# Patient Record
Sex: Male | Born: 1990
Health system: Southern US, Community
[De-identification: ages and names within clinical notes are randomized; demographics above are authoritative.]

## PROBLEM LIST (undated history)

## (undated) DIAGNOSIS — L03119 Cellulitis of unspecified part of limb: Secondary | ICD-10-CM

## (undated) HISTORY — DX: Cellulitis of unspecified part of limb: L03.119

---
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
## 2010-06-11 NOTE — Op Note (Signed)
NAME:  Chad Cooper, Chad Cooper              ACCOUNT NO.:  [id number]   MEDICAL RECORD NO.:  [id number]          PATIENT TYPE:  AMB   LOCATION:  DSC                          FACILITY:  [facility]   PHYSICIAN:  Loreta Ave, M.D. DATE OF BIRTH:  December 19, 1990   DATE OF PROCEDURE:  11/25/2007  DATE OF DISCHARGE:                               OPERATIVE REPORT   PREOPERATIVE DIAGNOSIS:  Nonunion, middle third fractures, scaphoid left  wrist.   POSTOPERATIVE DIAGNOSIS:  Nonunion, middle third fractures, scaphoid  left wrist.   PROCEDURE:  Open reduction and internal fixation, scaphoid, nonunion  with a 20-mm standard Acutrak screw.   SURGEON:  Loreta Ave, MD   ASSISTANT:  Genene Churn. Barry Dienes, [place], present throughout the entire case  necessary for timely completion of procedure.   ANESTHESIA:  General.   BLOOD LOSS:  Minimal.   TOURNIQUET TIME:  1 hour.   SPECIMENS:  None.   CULTURES:  None.   COMPLICATIONS:  None.   DRESSING:  Soft compressive with thumb spica splint.   PROCEDURE:  The patient was brought to the operating room and after  adequate anesthesia had been obtained, tourniquet applied to upper  aspect of left arm.  Prepped and draped in usual sterile fashion.  Exsanguinated with elevation and Esmarch.  Tourniquet inflated to 250  mmHg.  Fluoroscopic guidance throughout.  Incision over the volar aspect  of scaphoid.  Skin and subcutaneous tissue divided.  Tendons elevated.  Radial artery protected.  Volar wrist capsule identified and incised.  Scaphoid identified.  Although there was an obvious nonunion by CT scan  and x-ray, once exposed the site, there was actually a little bit of  healing around the margin.  There was not of enough erosion in the  fracture and given the amount that was there, I felt taking this down  with do more harm than good.  I therefore traversed the area of nonunion  with multiple K-wire drives from distal to proximal.  I then put in a K-  wire for  the screw across the fracture site.  Confirmed good position.  I drilled and then solidly fixed with a 20-mm Acutrak screw.  This was  countersunk.  The guidewire removed.  Excellent compression, capturing,  and fixation and the fracture line could no longer be seen once the  screw was in place.  Care was taken to be sure it was protruding in  any aspect the wrist.  Wound irrigated.  Capsule closed with Vicryl.  Skin closed with nylon.  Sterile compressive dressing and thumb spica  splint applied.  Tourniquet deflated and removed.  Anesthesia reversed.  Brought to recovery room.  He tolerated the surgery well.  No  complications.      Loreta Ave, M.D.  Electronically Signed     DFM/MEDQ  D:  11/25/2007  T:  11/26/2007  Job:  [id number]

## 2010-10-29 LAB — POCT HEMOGLOBIN-HEMACUE: Hemoglobin: 17.3 — ABNORMAL HIGH

## 2014-06-12 ENCOUNTER — Ambulatory Visit: Payer: Self-pay | Admitting: Primary Care

## 2014-06-19 ENCOUNTER — Ambulatory Visit (INDEPENDENT_AMBULATORY_CARE_PROVIDER_SITE_OTHER): Payer: 59 | Admitting: Internal Medicine

## 2014-06-19 ENCOUNTER — Encounter: Payer: Self-pay | Admitting: Internal Medicine

## 2014-06-19 VITALS — BP 117/76 | HR 76 | Temp 98.4°F | Ht 66.0 in | Wt 235.0 lb

## 2014-06-19 DIAGNOSIS — L03119 Cellulitis of unspecified part of limb: Secondary | ICD-10-CM | POA: Diagnosis not present

## 2014-06-19 MED ORDER — DOXYCYCLINE HYCLATE 100 MG PO TABS
100.0000 mg | ORAL_TABLET | Freq: Two times a day (BID) | ORAL | Status: DC
Start: 2014-06-19 — End: 2014-09-01

## 2014-06-19 MED ORDER — RIFAMPIN 300 MG PO CAPS: 300.0000 mg | ORAL_CAPSULE | Freq: Two times a day (BID) | ORAL | Status: DC

## 2014-06-19 MED ORDER — MUPIROCIN 2 % EX OINT: 1.0000 "application " | TOPICAL_OINTMENT | Freq: Two times a day (BID) | CUTANEOUS | Status: DC

## 2014-06-19 MED ORDER — CHLORHEXIDINE GLUCONATE 4 % EX LIQD: Freq: Every day | CUTANEOUS | Status: DC | PRN

## 2014-06-19 NOTE — Progress Notes (Signed)
Subjective:    Patient ID: Chad Cooper, male    DOB: [birth year], 24 y.o.   MRN: [id number]  HPI Chad Cooper is a healthy 24yo Male who has had recurrent MRSA skin infections. He states that it started In 10th grade, presumed left leg lesions after trauma. He now has at least 3-4 episodes a year of having skin lesions.  He first notices first having inguinal lymph node swelling, hot and tender to touch, with swelling as well as indurated. He feels that some of these episodes are triggered by scratch of skin. No fever, or chills. Just getting doxycycline course, 10 day course. Improved after end of prescription. He often goes promptly to the MD for care so his skin lesions does not progress into abscess. He finished his last abtx course from 4/18-4/28. No active skin problems presently. He brings with him pictures on iphone showing chronology of onset of rash/cellulitis No Known Allergies  Active Ambulatory Problems    Diagnosis Date Noted  . Recurrent cellulitis of lower leg 06/19/2014   Resolved Ambulatory Problems    Diagnosis Date Noted  . No Resolved Ambulatory Problems   No Additional Past Medical History  - left wrist fracture, with HW fixation  History  Substance Use Topics  . Smoking status: Never Smoker   . Smokeless tobacco: Never Used  . Alcohol Use: No  family history is not on file.  Review of Systems  Constitutional: Negative for fever, chills, diaphoresis, activity change, appetite change, fatigue and unexpected weight change.  HENT: Negative for congestion, sore throat, rhinorrhea, sneezing, trouble swallowing and sinus pressure.  Eyes: Negative for photophobia and visual disturbance.  Respiratory: Negative for cough, chest tightness, shortness of breath, wheezing and stridor.  Cardiovascular: Negative for chest pain, palpitations and leg swelling.  Gastrointestinal: Negative for nausea, vomiting, abdominal pain, diarrhea, constipation, blood in stool, abdominal  distention and anal bleeding.  Genitourinary: Negative for dysuria, hematuria, flank pain and difficulty urinating.  Musculoskeletal: Negative for myalgias, back pain, joint swelling, arthralgias and gait problem.  Skin: Negative for color change, pallor, rash and wound.  Neurological: Negative for dizziness, tremors, weakness and light-headedness.  Hematological: Negative for adenopathy. Does not bruise/bleed easily.  Psychiatric/Behavioral: Negative for behavioral problems, confusion, sleep disturbance, dysphoric mood, decreased concentration and agitation.       Objective:   Physical Exam BP 117/76 mmHg  Pulse 76  Temp(Src) 98.4 F (36.9 C) (Oral)  Ht  (1.676 m)  Wt 235 lb (106.595 kg)  BMI 37.95 kg/m2 Physical Exam  Constitutional: He is oriented to person, place, and time. He appears well-developed and well-nourished. No distress.  HENT:  Mouth/Throat: Oropharynx is clear and moist. No oropharyngeal exudate.  Cardiovascular: Normal rate, regular rhythm and normal heart sounds. Exam reveals no gallop and no friction rub.  No murmur heard.  Pulmonary/Chest: Effort normal and breath sounds normal. No respiratory distress. He has no wheezes.  Abdominal: Soft. Bowel sounds are normal. He exhibits no distension. There is no tenderness.  Lymphadenopathy:  He has no cervical adenopathy.  Neurological: He is alert and oriented to person, place, and time.  Skin: Skin is warm and dry. No rash noted. No erythema. Faint hyperpigmented scar to left ankle Psychiatric: He has a normal mood and affect. His behavior is normal.          Assessment & Plan:  - will check mrsa and do decolonizatoin, will give bactrim plus rifampin plus hibiclens daily and mupiricin bid x 10 days  to see if that will break the cycle of skin lesions.  - rtc prn, see if need to image affected leg

## 2014-06-21 LAB — NASAL CULTURE (N/P): Organism ID, Bacteria: NORMAL

## 2014-09-01 ENCOUNTER — Telehealth: Payer: Self-pay | Admitting: *Deleted

## 2014-09-01 DIAGNOSIS — L03119 Cellulitis of unspecified part of limb: Secondary | ICD-10-CM

## 2014-09-01 MED ORDER — DOXYCYCLINE HYCLATE 100 MG PO TABS: 100.0000 mg | ORAL_TABLET | Freq: Two times a day (BID) | ORAL | Status: DC

## 2014-09-01 NOTE — Telephone Encounter (Signed)
Patient called and advised the Cellulitis on his leg is back and wants to know what he should do now. He was told to give [place] a call if it reoccurred. Advised him will let his doctor know and call him back. He advised it is swollen, red and a little painful just as it was when it first happened. Reports no fever, chills or sweats.

## 2014-09-01 NOTE — Addendum Note (Signed)
Addended by: Lurlean Leyden on: 09/01/2014 05:01 PM   Modules accepted: Orders

## 2014-09-01 NOTE — Telephone Encounter (Signed)
Called the patient and verified his pharmacy information and scheduled him an appt to see the doctor. He was unable to come Thursday but can come Tuesday.

## 2014-09-01 NOTE — Telephone Encounter (Signed)
Can you prescribe him 10 day course of doxycycline  BID and see if we can see him at the end of next week.(thur add on)

## 2014-09-05 ENCOUNTER — Encounter: Payer: Self-pay | Admitting: Internal Medicine

## 2014-09-05 ENCOUNTER — Ambulatory Visit (INDEPENDENT_AMBULATORY_CARE_PROVIDER_SITE_OTHER): Payer: 59 | Admitting: Internal Medicine

## 2014-09-05 VITALS — BP 121/77 | HR 76 | Temp 97.3°F | Wt 230.0 lb

## 2014-09-05 DIAGNOSIS — L03119 Cellulitis of unspecified part of limb: Secondary | ICD-10-CM | POA: Diagnosis not present

## 2014-09-05 MED ORDER — DOXYCYCLINE HYCLATE 100 MG PO TABS: 100.0000 mg | ORAL_TABLET | Freq: Two times a day (BID) | ORAL | Status: DC
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
## 2015-07-27 ENCOUNTER — Telehealth: Payer: Self-pay | Admitting: *Deleted

## 2015-07-27 DIAGNOSIS — L03119 Cellulitis of unspecified part of limb: Secondary | ICD-10-CM

## 2015-07-27 NOTE — Telephone Encounter (Signed)
Patient called triage to see if Dr. Drue SecondSnider can call him in a refill of Doxy, stating that his cellulitis has flared up again. He reports taking the prescription given to him last year for another flare, took his grandmother's keflex for another flare in February.  Patient is startign a new job, is without insurance at this time and cannot come in for a visit.  Please advise. Andree CossHowell, Chad Stidham M, RN

## 2015-08-01 MED ORDER — DOXYCYCLINE HYCLATE 100 MG PO TABS: 100.0000 mg | ORAL_TABLET | Freq: Two times a day (BID) | ORAL | Status: DC

## 2015-08-01 NOTE — Telephone Encounter (Signed)
Can give him a doxy 100mg  bid x 7 days

## 2015-08-01 NOTE — Addendum Note (Signed)
Addended by: Andree CossHOWELL, Oluwateniola Leitch M on: 08/01/2015 04:05 PM   Modules accepted: Orders

## 2015-08-01 NOTE — Telephone Encounter (Signed)
Sent prescription, notified patient. Andree CossHowell, Michelle M, RN

## 2015-10-05 ENCOUNTER — Telehealth: Payer: Self-pay

## 2015-10-05 NOTE — Telephone Encounter (Signed)
We haven't seen him since May 2016. Would recommend he goes to urgent care or can fit him in on Monday to assess his cellulitis

## 2015-10-05 NOTE — Telephone Encounter (Signed)
Patient called requesting antibiotics for a flare up of cellulitis on his leg. Patient states he still does not have insurance and will not have any until January. Explained to patient once a response is given he will be notified with a response. Patient stated understanding. Please advise. Rejeana Brockandace Sharyon Peitz, LPN

## 2015-10-09 NOTE — Telephone Encounter (Signed)
Patient left message on triage voicemail asking how much a doctor's visit would be, as he currently does not have insurance.  Andree CossHowell, Suleika Donavan M, RN

## 2015-10-09 NOTE — Telephone Encounter (Signed)
Called patient and relayed Dr. Drue SecondSnider response. Patient stated he will go to urgent care because it will be cheaper. Rejeana Brockandace Murray, LPN

## 2016-09-10 ENCOUNTER — Emergency Department (HOSPITAL_COMMUNITY): Payer: 59

## 2016-09-10 ENCOUNTER — Emergency Department (HOSPITAL_COMMUNITY)
Admission: EM | Admit: 2016-09-10 | Discharge: 2016-09-10 | Disposition: A | Discharge status: Home / Self Care | Payer: 59 | Attending: Emergency Medicine | Admitting: Emergency Medicine

## 2016-09-10 ENCOUNTER — Encounter (HOSPITAL_COMMUNITY): Payer: Self-pay

## 2016-09-10 DIAGNOSIS — M79662 Pain in left lower leg: Secondary | ICD-10-CM | POA: Diagnosis present

## 2016-09-10 DIAGNOSIS — L03116 Cellulitis of left lower limb: Secondary | ICD-10-CM

## 2016-09-10 DIAGNOSIS — R590 Localized enlarged lymph nodes: Secondary | ICD-10-CM | POA: Insufficient documentation

## 2016-09-10 DIAGNOSIS — R591 Generalized enlarged lymph nodes: Secondary | ICD-10-CM

## 2016-09-10 LAB — CBC WITH DIFFERENTIAL/PLATELET
BASOS PCT: 0 %
Basophils Absolute: 0 10*3/uL (ref 0.0–0.1)
EOS ABS: 0 10*3/uL (ref 0.0–0.7)
EOS PCT: 0 %
HEMATOCRIT: 46.6 % (ref 39.0–52.0)
HEMOGLOBIN: 17.1 g/dL — AB (ref 13.0–17.0)
LYMPHS ABS: 0.8 10*3/uL (ref 0.7–4.0)
Lymphocytes Relative: 6 %
MCH: 31.5 pg (ref 26.0–34.0)
MCHC: 36.7 g/dL — AB (ref 30.0–36.0)
MCV: 86 fL (ref 78.0–100.0)
MONO ABS: 0.4 10*3/uL (ref 0.1–1.0)
MONOS PCT: 3 %
NEUTROS PCT: 91 %
Neutro Abs: 11.3 10*3/uL — ABNORMAL HIGH (ref 1.7–7.7)
Platelets: 142 10*3/uL — ABNORMAL LOW (ref 150–400)
RBC: 5.42 MIL/uL (ref 4.22–5.81)
RDW: 12.2 % (ref 11.5–15.5)
WBC: 12.5 10*3/uL — ABNORMAL HIGH (ref 4.0–10.5)

## 2016-09-10 LAB — URINALYSIS, ROUTINE W REFLEX MICROSCOPIC
Bilirubin Urine: NEGATIVE
Glucose, UA: NEGATIVE mg/dL
HGB URINE DIPSTICK: NEGATIVE
Ketones, ur: NEGATIVE mg/dL
Leukocytes, UA: NEGATIVE
Nitrite: NEGATIVE
PH: 5 (ref 5.0–8.0)
Protein, ur: NEGATIVE mg/dL
SPECIFIC GRAVITY, URINE: 1.034 — AB (ref 1.005–1.030)

## 2016-09-10 LAB — COMPREHENSIVE METABOLIC PANEL
ALK PHOS: 35 U/L — AB (ref 38–126)
ALT: 26 U/L (ref 17–63)
AST: 37 U/L (ref 15–41)
Albumin: 4.3 g/dL (ref 3.5–5.0)
Anion gap: 8 (ref 5–15)
BUN: 19 mg/dL (ref 6–20)
CALCIUM: 9.1 mg/dL (ref 8.9–10.3)
CHLORIDE: 100 mmol/L — AB (ref 101–111)
CO2: 29 mmol/L (ref 22–32)
CREATININE: 1.25 mg/dL — AB (ref 0.61–1.24)
GFR calc non Af Amer: >60 mL/min (ref >60)
Glucose, Bld: 138 mg/dL — ABNORMAL HIGH (ref 65–99)
Potassium: 3.6 mmol/L (ref 3.5–5.1)
SODIUM: 137 mmol/L (ref 135–145)
Total Bilirubin: 1.4 mg/dL — ABNORMAL HIGH (ref 0.3–1.2)
Total Protein: 6.7 g/dL (ref 6.5–8.1)

## 2016-09-10 MED ORDER — SODIUM CHLORIDE 0.9 % IV BOLUS (SEPSIS)
1000.0000 mL | Freq: Once | INTRAVENOUS | Status: AC
  Administered 2016-09-10: 1000 mL via INTRAVENOUS

## 2016-09-10 MED ORDER — SODIUM CHLORIDE 0.9 % IV SOLN
1000.0000 mL | INTRAVENOUS | Status: DC
  Administered 2016-09-10: 1000 mL via INTRAVENOUS

## 2016-09-10 MED ORDER — IBUPROFEN 800 MG PO TABS: 800.0000 mg | ORAL_TABLET | Freq: Three times a day (TID) | ORAL | 0 refills | Status: AC

## 2016-09-10 MED ORDER — VANCOMYCIN HCL IN DEXTROSE 1-5 GM/200ML-% IV SOLN
1000.0000 mg | Freq: Once | INTRAVENOUS | Status: AC
  Administered 2016-09-10: 1000 mg via INTRAVENOUS
  Filled 2016-09-10: qty 200

## 2016-09-10 MED ORDER — DEXTROSE 5 % IV SOLN
1.0000 g | Freq: Once | INTRAVENOUS | Status: AC
  Administered 2016-09-10: 1 g via INTRAVENOUS
  Filled 2016-09-10: qty 10

## 2016-09-10 MED ORDER — DOXYCYCLINE HYCLATE 100 MG PO CAPS: 100.0000 mg | ORAL_CAPSULE | Freq: Two times a day (BID) | ORAL | 0 refills | Status: DC

## 2016-09-10 MED ORDER — GADOBENATE DIMEGLUMINE 529 MG/ML IV SOLN
20.0000 mL | Freq: Once | INTRAVENOUS | Status: AC | PRN
  Administered 2016-09-10: 20 mL via INTRAVENOUS

## 2016-09-10 NOTE — ED Provider Notes (Signed)
Patient rechecked at 2045: No evidence. He is hemodynamically stable.MRI results iscussed with patient and his mother. Discharge medication doxycycline 100 mg twice a day   Donnetta Hutchingook, Delight Bickle, MD 09/10/16 2059

## 2016-09-10 NOTE — ED Notes (Signed)
Infectious disease re-paged to Dr. Donnald GarrePfeiffer.

## 2016-09-10 NOTE — ED Triage Notes (Signed)
Pt endorses left sided groin pain since this am with some lightheadedness. Pt has recurrent cellulitis of lower extremities x 10 years. Denies urinary sx. AxOx4.

## 2016-09-10 NOTE — ED Notes (Signed)
Pt still in MRI 

## 2016-09-10 NOTE — ED Notes (Signed)
ED Provider at bedside. 

## 2016-09-10 NOTE — Discharge Instructions (Signed)
MRI showed no ate problems.  Prescription for doxycycline. Follow-up with Dr. Ilsa IhaSnyder [phone]

## 2016-09-10 NOTE — ED Notes (Addendum)
Vancomycin and bolus restarted, paused in MRI for scan. Spoke with Dr. Adriana Simasook, infectious disease waiting on pt MRI to result. Pt and family updated to delay.

## 2016-09-10 NOTE — ED Notes (Signed)
Patient transported to MRI 

## 2016-09-10 NOTE — ED Notes (Signed)
Pt returned from MRI °

## 2016-09-10 NOTE — ED Notes (Signed)
Per MRI, pt to be transported to scan in approx 2 hours.

## 2016-09-10 NOTE — ED Notes (Signed)
Declined W/C at D/C and was escorted to lobby by RN. 
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
## 2016-09-12 ENCOUNTER — Emergency Department (HOSPITAL_BASED_OUTPATIENT_CLINIC_OR_DEPARTMENT_OTHER)
Admission: EM | Admit: 2016-09-12 | Discharge: 2016-09-12 | Disposition: A | Discharge status: Home / Self Care | Payer: 59 | Attending: Emergency Medicine | Admitting: Emergency Medicine

## 2016-09-12 ENCOUNTER — Encounter (HOSPITAL_BASED_OUTPATIENT_CLINIC_OR_DEPARTMENT_OTHER): Payer: Self-pay

## 2016-09-12 DIAGNOSIS — L03116 Cellulitis of left lower limb: Secondary | ICD-10-CM | POA: Insufficient documentation

## 2016-09-12 DIAGNOSIS — M79662 Pain in left lower leg: Secondary | ICD-10-CM | POA: Diagnosis present

## 2016-09-12 LAB — CBC WITH DIFFERENTIAL/PLATELET
BASOS ABS: 0 10*3/uL (ref 0.0–0.1)
BASOS PCT: 0 %
EOS ABS: 0 10*3/uL (ref 0.0–0.7)
EOS PCT: 1 %
HCT: 44.8 % (ref 39.0–52.0)
Hemoglobin: 16.3 g/dL (ref 13.0–17.0)
Lymphocytes Relative: 16 %
Lymphs Abs: 1.3 10*3/uL (ref 0.7–4.0)
MCH: 31.5 pg (ref 26.0–34.0)
MCHC: 36.4 g/dL — ABNORMAL HIGH (ref 30.0–36.0)
MCV: 86.7 fL (ref 78.0–100.0)
MONO ABS: 0.5 10*3/uL (ref 0.1–1.0)
Monocytes Relative: 7 %
Neutro Abs: 6 10*3/uL (ref 1.7–7.7)
Neutrophils Relative %: 76 %
PLATELETS: 122 10*3/uL — AB (ref 150–400)
RBC: 5.17 MIL/uL (ref 4.22–5.81)
RDW: 12.3 % (ref 11.5–15.5)
WBC: 7.8 10*3/uL (ref 4.0–10.5)

## 2016-09-12 LAB — BASIC METABOLIC PANEL
ANION GAP: 9 (ref 5–15)
BUN: 13 mg/dL (ref 6–20)
CALCIUM: 8.4 mg/dL — AB (ref 8.9–10.3)
CO2: 26 mmol/L (ref 22–32)
Chloride: 104 mmol/L (ref 101–111)
Creatinine, Ser: 1.04 mg/dL (ref 0.61–1.24)
GFR calc Af Amer: >60 mL/min (ref >60)
GLUCOSE: 95 mg/dL (ref 65–99)
POTASSIUM: 3.7 mmol/L (ref 3.5–5.1)
SODIUM: 139 mmol/L (ref 135–145)

## 2016-09-12 MED ORDER — CEFAZOLIN SODIUM 1 G IJ SOLR
INTRAMUSCULAR | Status: AC
  Administered 2016-09-12: 2000 mg
  Filled 2016-09-12: qty 20

## 2016-09-12 MED ORDER — CLINDAMYCIN HCL 300 MG PO CAPS: 600.0000 mg | ORAL_CAPSULE | Freq: Three times a day (TID) | ORAL | 0 refills | Status: DC

## 2016-09-12 MED ORDER — CEFAZOLIN SODIUM-DEXTROSE 2-4 GM/100ML-% IV SOLN
2.0000 g | Freq: Once | INTRAVENOUS | Status: AC
Start: 2016-09-12 — End: 2016-09-12
  Administered 2016-09-12: 2 g via INTRAVENOUS
  Filled 2016-09-12: qty 100

## 2016-09-12 MED ORDER — CEPHALEXIN 500 MG PO CAPS: 500.0000 mg | ORAL_CAPSULE | Freq: Four times a day (QID) | ORAL | 0 refills | Status: AC

## 2016-09-12 MED FILL — CEPHALEXIN 500 MG CAPSULE: 500 | 10 days supply | Qty: 40 | Fill #0

## 2016-09-12 MED FILL — CLINDAMYCIN HCL 300 MG CAPS: 300 | 7 days supply | Qty: 42 | Fill #0

## 2016-09-12 NOTE — Discharge Instructions (Signed)
Please read instructions below. Stop taking the Doxycycline. Begin taking Keflex/cephalexin, 4 times per day, until it is gone. Begin taking Clindamycin, 3 times per day, until it is gone. You can take Advil every 6 hours as needed for pain. Attend your appointment with the infectious disease clinic next week for follow-up. Return to the ER for fever, nausea, vomiting, or new or concerning symptoms.

## 2016-09-12 NOTE — ED Provider Notes (Signed)
[facility] Provider Note   CSN: [id number] Arrival date & time: 09/12/16  1247     History   Chief Complaint Chief Complaint  Patient presents with  . Wound Check    HPI Chad Cooper is a 26 y.o. male presenting for recheck of cellulitis on R lower leg that began Wednesday morning. Patient reported to the ED on Wednesday, where he received IV vancomycin and Rocephin, with normal MRI of leg. Dr. Algis Liming with infectious disease was consulted during this visit as was noted to be making Dr. Drue Second aware of patient. He was discharged on doxycycline and instructions to follow up in 2 days. Pt reports hx of recurrent cellulitis to left lower leg without known cause. States it normally resolves with oral doxycycline. Patient reports to this ED today for follow-up, stating redness has expanded with worsening pain. States he has been taking the antibiotic as prescribed. Denies fever, chills, nausea, vomiting, or any other symptoms today. States he has attempted to see Infectious Disease clinic in the past for this issue, and was told to return with active infection, however states he has been unable to get an appointment in a reasonable amount of time.   The history is provided by the patient.    Past Medical History:  Diagnosis Date  . Recurrent cellulitis of lower leg     Patient Active Problem List   Diagnosis Date Noted  . Recurrent cellulitis of lower leg 06/19/2014    History reviewed. No pertinent surgical history.     Home Medications    Prior to Admission medications   Medication Sig Start Date End Date Taking? Authorizing Provider  cephALEXin (KEFLEX) 500 MG capsule Take 1 capsule (500 mg total) by mouth 4 (four) times daily. 09/12/16 09/22/16  Russo, [place] N, PA-C  clindamycin (CLEOCIN) 300 MG capsule Take 2 capsules (600 mg total) by mouth 3 (three) times daily. 09/12/16 09/19/16  Russo, [place] N, PA-C  doxycycline (VIBRAMYCIN) 100 MG capsule Take 1 capsule  (100 mg total) by mouth 2 (two) times daily. One po bid x 7 days 09/10/16   Arby Barrette, MD  doxycycline (VIBRAMYCIN) 100 MG capsule Take 1 capsule (100 mg total) by mouth 2 (two) times daily. 09/10/16   Donnetta Hutching, MD  ibuprofen (ADVIL,MOTRIN) 800 MG tablet Take 1 tablet (800 mg total) by mouth 3 (three) times daily. 09/10/16   Arby Barrette, MD    Family History No family history on file.  Social History Social History  Substance Use Topics  . Smoking status: Never Smoker  . Smokeless tobacco: Never Used  . Alcohol use 0.0 oz/week     Comment: occ     Allergies   Patient has no known allergies.   Review of Systems Review of Systems  Constitutional: Positive for chills. Negative for fever.  HENT: Negative.   Eyes: Negative.   Respiratory: Negative.   Cardiovascular: Negative.   Gastrointestinal: Negative for nausea and vomiting.  Genitourinary: Negative.   Musculoskeletal: Positive for myalgias.  Skin: Positive for color change.  Allergic/Immunologic: Negative for immunocompromised state.  Neurological: Negative for numbness.     Physical Exam Updated Vital Signs BP 129/69 (BP Location: Right Arm)   Pulse 77   Temp 98.1 F (36.7 C) (Oral)   Resp 18   Ht 5\' 6"  (1.676 m)   Wt 89.4 kg (197 lb 1.5 oz)   SpO2 99%   BMI 31.81 kg/m   Physical Exam  Constitutional: He appears well-developed and  well-nourished. No distress.  Well-appearing, nontoxic  HENT:  Head: Normocephalic and atraumatic.  Eyes: Conjunctivae are normal.  Neck: Normal range of motion. Neck supple.  Cardiovascular: Normal rate, regular rhythm, normal heart sounds and intact distal pulses.   Intact DP and PT pulses  Pulmonary/Chest: Effort normal and breath sounds normal. No respiratory distress. He has no wheezes. He has no rales.  Abdominal: Soft.  Musculoskeletal:  Compartments are soft. Normal range of motion of ankle and knee.  Neurological: He is alert.  Skin: Skin is warm.  Left  distal anterior lower leg with warmth, erythema, and induration. See image.   Psychiatric: He has a normal mood and affect. His behavior is normal.  Nursing note and vitals reviewed.          ED Treatments / Results  Labs (all labs ordered are listed, but only abnormal results are displayed) Labs Reviewed  CBC WITH DIFFERENTIAL/PLATELET - Abnormal; Notable for the following:       Result Value   MCHC 36.4 (*)    Platelets 122 (*)    All other components within normal limits  BASIC METABOLIC PANEL - Abnormal; Notable for the following:    Calcium 8.4 (*)    All other components within normal limits    EKG  EKG Interpretation None       Radiology Mr Tibia Fibula Left W Wo Contrast  Result Date: 09/10/2016 CLINICAL DATA:  Recurrent left lower extremity cellulitis. Suspected osteomyelitis. EXAM: MRI OF LOWER LEFT EXTREMITY WITHOUT AND WITH CONTRAST TECHNIQUE: Multiplanar, multisequence MR imaging of the left lower leg was performed both before and after administration of intravenous contrast. CONTRAST:  20mL MULTIHANCE GADOBENATE DIMEGLUMINE 529 MG/ML IV SOLN COMPARISON:  None. FINDINGS: Bones/Joint/Cartilage Extremity coil was utilized. Both lower legs are included on the axial and coronal images. No evidence of osteomyelitis. The left tibia and fibula appear normal. There is no bone marrow edema, cortical destruction or abnormal enhancement. No acute osseous findings are seen. There are no large effusions at the knees or ankles. Ligaments Not relevant for exam/indication. Muscles and Tendons Normal. Soft tissues The subcutaneous tissues appear normal. Specifically, no soft tissue edema, abnormal enhancement or focal fluid collection identified. The vascular structures appear unremarkable. IMPRESSION: No evidence of left lower extremity osteomyelitis or other active infection. Electronically Signed   By: Carey Bullocks M.D.   On: 09/10/2016 17:37    Procedures Procedures  (including critical care time)  Medications Ordered in ED Medications  ceFAZolin (ANCEF) IVPB 2g/100 mL premix (0 g Intravenous Stopped 09/12/16 1513)  ceFAZolin (ANCEF) 1 g injection (2,000 mg  Given 09/12/16 1400)     Initial Impression / Assessment and Plan / ED Course  I have reviewed the triage vital signs and the nursing notes.  Pertinent labs & imaging results that were available during my care of the patient were reviewed by me and considered in my medical decision making (see chart for details).     Patient presenting for worsening left lower leg cellulitis. Pt reports hx of recurrent cellulitis to left lower leg without known cause. Pt was seen 2 days ago at [facility] ED, where he received IV vancomycin and Rocephin, and was discharged with PO doxycycline. MRI done without evidence of osteomyelitis or deep tissue infection. On exam today, patient with worsening cellulitis as compared to images in chart from recent ED visit. Patient without systemic symptoms and is afebrile. ID consulted: spoke with Dr. Drue Second, who recommends pt discontinue doxy, IV ancef  in ED, discharge with PO Keflex and Clinda with follow up outpt next week. Ancef given in ED.  Patient remains afebrile in ED, WBC down to 7.8 from 12.5 2 days ago . CMP unremarkable. Blood cultures drawn 2 days ago without growth. Discussed plan with patient and is agreeable. Pt is safe for discharge home. Strict return precautions discussed.  Patient discussed with and seen by Dr. Fayrene Fearing.  Discussed results, findings, treatment and follow up. Patient advised of return precautions. Patient verbalized understanding and agreed with plan.  Final Clinical Impressions(s) / ED Diagnoses   Final diagnoses:  Cellulitis of left anterior lower leg    New Prescriptions Discharge Medication List as of 09/12/2016  3:31 PM    START taking these medications   Details  cephALEXin (KEFLEX) 500 MG capsule Take 1 capsule (500 mg total) by mouth 4  (four) times daily., Starting Fri 09/12/2016, Until Mon 09/22/2016, Print    clindamycin (CLEOCIN) 300 MG capsule Take 2 capsules (600 mg total) by mouth 3 (three) times daily., Starting Fri 09/12/2016, Until Fri 09/19/2016, Print         Russo, [place] N, PA-C 09/12/16 1725    Rolland Porter, MD 09/20/16 [phone]

## 2016-09-12 NOTE — ED Triage Notes (Addendum)
Pt states he is here for recheck of infection to left leg-seen at [facility] ED 2 days-states area is no better-NAD-steady gait

## 2016-09-15 LAB — CULTURE, BLOOD (ROUTINE X 2)
Culture: NO GROWTH
Culture: NO GROWTH
SPECIAL REQUESTS: ADEQUATE
Special Requests: ADEQUATE

## 2016-09-18 ENCOUNTER — Ambulatory Visit (INDEPENDENT_AMBULATORY_CARE_PROVIDER_SITE_OTHER): Payer: 59 | Admitting: Internal Medicine

## 2016-09-18 ENCOUNTER — Encounter: Payer: Self-pay | Admitting: Internal Medicine

## 2016-09-18 VITALS — BP 107/70 | HR 66 | Wt 196.0 lb

## 2016-09-18 DIAGNOSIS — L03119 Cellulitis of unspecified part of limb: Secondary | ICD-10-CM | POA: Diagnosis not present

## 2016-09-18 MED ORDER — CLINDAMYCIN HCL 300 MG PO CAPS: 600.0000 mg | ORAL_CAPSULE | Freq: Three times a day (TID) | ORAL | 0 refills | Status: AC

## 2016-09-18 NOTE — Patient Instructions (Signed)
Please give me a call the next time you have cellulitis to your right leg and start antibiotics. Our clinic number :  [phone]  Remember to use lotion during the winter and switch from body wash to dove soap

## 2016-09-18 NOTE — Progress Notes (Signed)
   Subjective:   Patient ID: Chad Cooper, male    DOB: [birth year] y.o.   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 He first noticed the lesions when he was in 10th grade and has been having approximately 3-4 episodes of recurrence per year since. This past year he has only had one prior episode. He was seen in the ED on 8/15 and 8/17 for recent episode of cellulitis. The patient states that he always has groin pain and inflammed lymph node accompanying his cellulitis. The patient does not note any baseline left lower extremity swelling in comparison to his right   MRI (8/15) did not show any evidence of osteomyelitis or other infection. He was given iv vancomycin and rocephin during 8/15 ED visit and discharged on doxycycline with follow up in 2 days in ED. At follow-up on 8/17 the patient reported that that he had worsening pain and redness despite antibiotic use. The patient's doxycycline was discontinued, he was given iv ancef in ed and discharged on po keflex and clindamycin. The patient states that he has been adherent to his medication and has 5 doses of the clindamycin remaining.    No Known Allergies      Active Ambulatory Problems    Diagnosis Date Noted  . Recurrent cellulitis of lower leg 06/19/2014       Resolved Ambulatory Problems    Diagnosis Date Noted  . No Resolved Ambulatory Problems   No Additional Past Medical History  - left wrist fracture, with HW fixation      History  Substance Use Topics  . Smoking status: Never Smoker   . Smokeless tobacco: Never Used  . Alcohol Use: No  family history is not on file.  Review of Systems Per HPI     Objective:   Physical Exam BP 117/76 mmHg  Pulse 76  Temp(Src) 98.4 F (36.9 C) (Oral)  Ht 5\' 6"  (1.676 m)  Wt 235 lb (106.595 kg)  BMI 37.95 kg/m2 Physical Exam          Assessment & Plan:   Chronic Cellulitis The cause for the patient's  recurrent cellulitis is peculiar as he does not have physical exam findings for lymphedema, he is not extremely obese (bmi=31.64), and does not have repeated trauma. However, since the patient leads an active lifestyle he might be having numerous small inoculations that can be contributing to repeated small skin breakdown.  -Prescribed clindamycin for the patient to keep with him and use as needed -Instructed patient to contact ID clinic immediately if another episode of cellulitis arises -Will consider vascular study if patient continues to have numerous episodes of chronic cellulitis in left leg more frequently

## 2018-12-25 IMAGING — MR MR [PERSON_NAME] LOW WO/W CM*L*
5 of 10 series · 19 of 40 positions shown · IV contrast (multihance)
Comparison: None.

CLINICAL DATA: Recurrent left lower extremity cellulitis. Suspected
osteomyelitis.

EXAM:
MRI OF LOWER LEFT EXTREMITY WITHOUT AND WITH CONTRAST
TECHNIQUE: Multiplanar, multisequence MR imaging of the left lower leg was
performed both before and after administration of intravenous
contrast.
CONTRAST:  20mL MULTIHANCE GADOBENATE DIMEGLUMINE 529 MG/ML IV SOLN

[Series 4: T1 · coronal · 5.0mm · 0.82mm/px · 2 of 20 slices shown (1 of 3)]
[im 1/20]
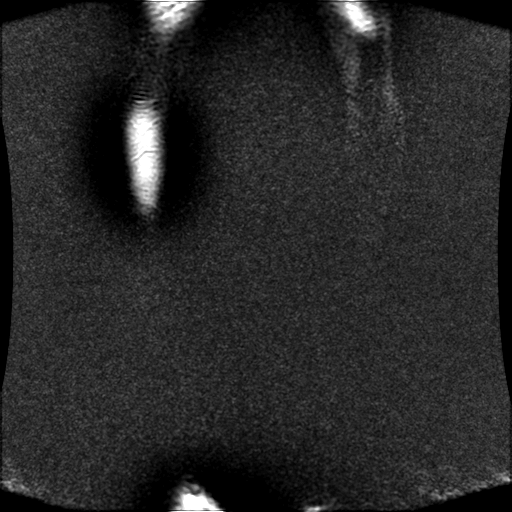
[im 20/20]
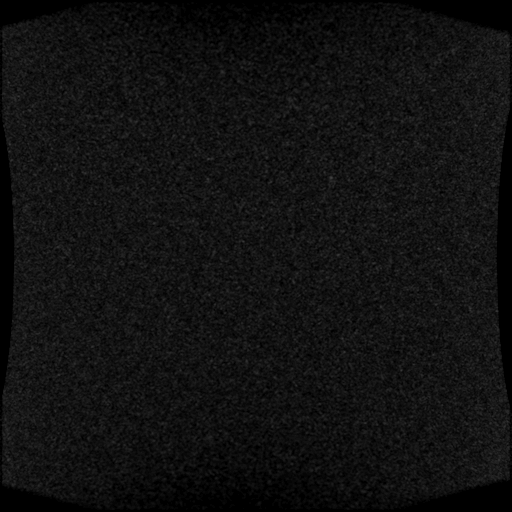

[Series 6: T1 · axial · 7.0mm · 0.68mm/px · z∈[-211,+185]mm · 4 of 30 slices shown (2 of 3)]
[im 1/30]
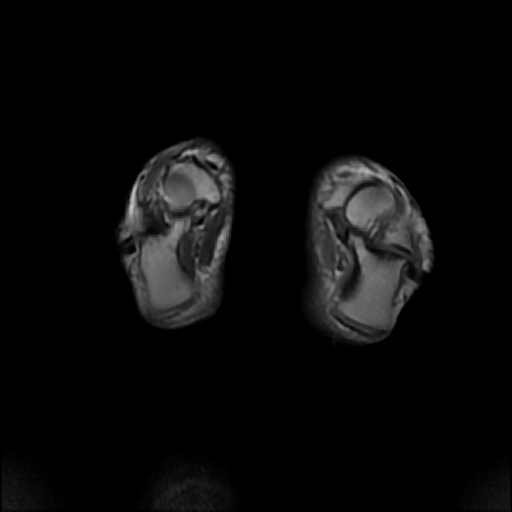
[im 10/30]
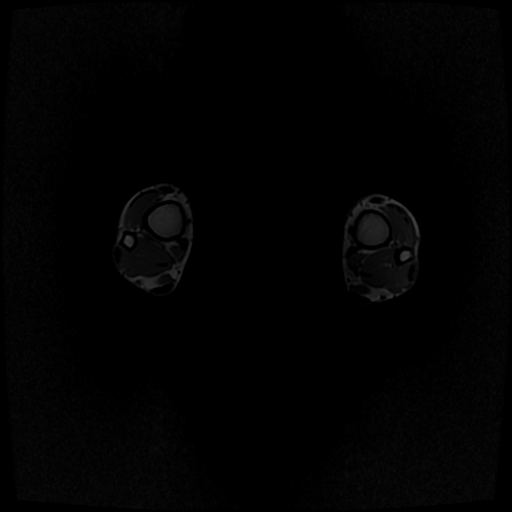
[im 20/30]
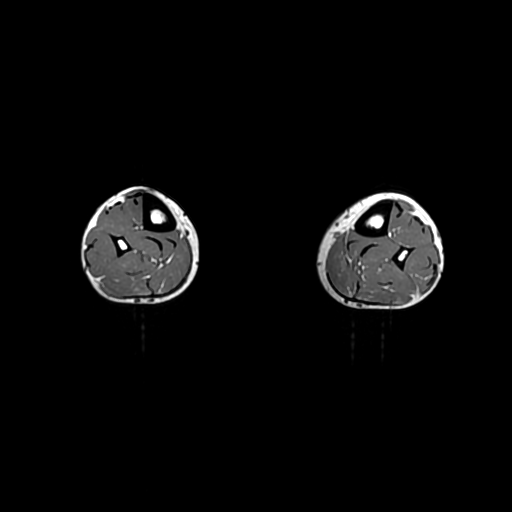
[im 30/30]
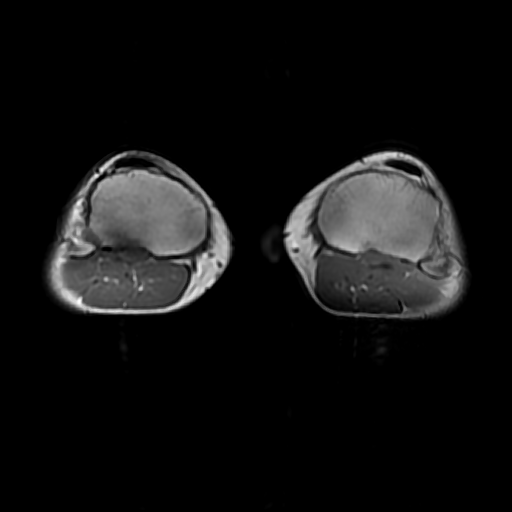

[Series 8: T2 fat-sat · axial · 7.0mm · 0.68mm/px · z∈[-211,+185]mm · 7 of 45 slices shown]
[im 1/45]
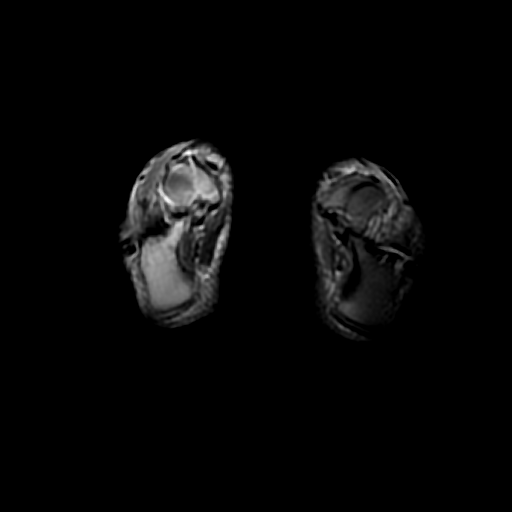
[im 8/45]
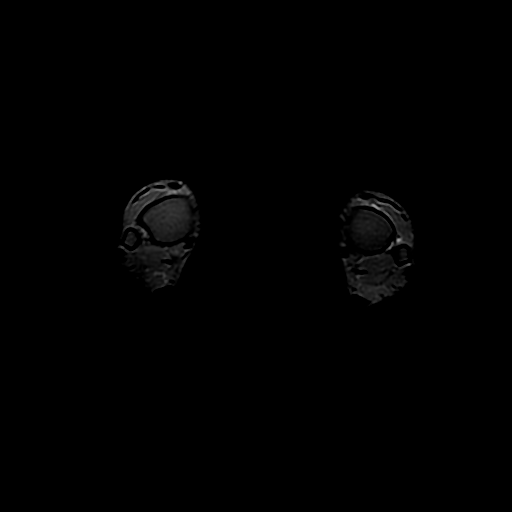
[im 15/45]
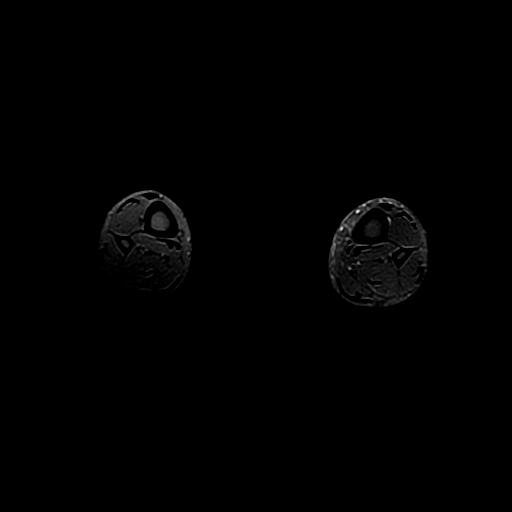
[im 23/45]
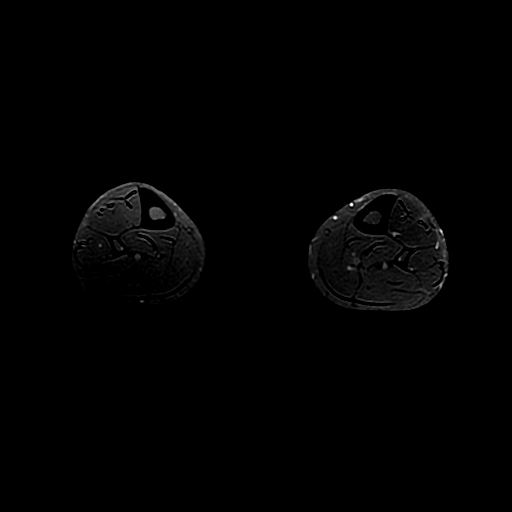
[im 30/45]
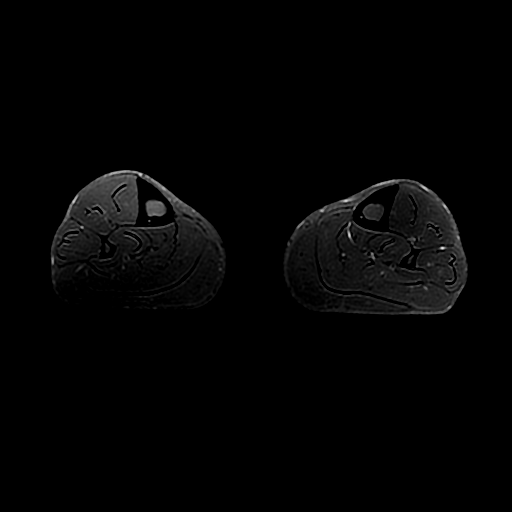
[im 37/45]
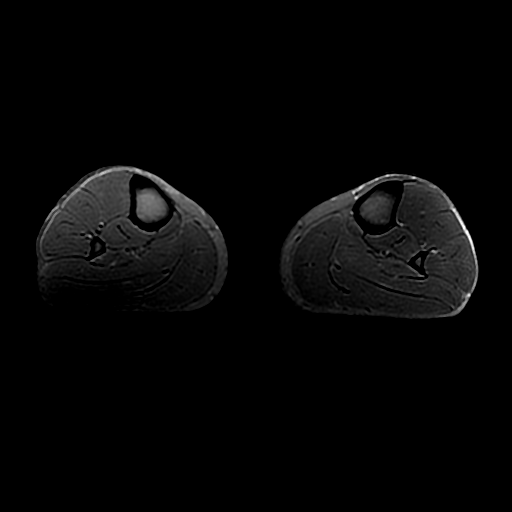
[im 45/45]
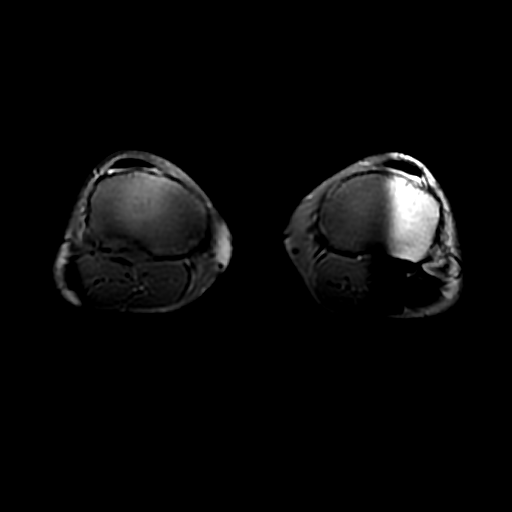

[Series 9: T1 · sagittal · 5.0mm · 0.78mm/px · 4 of 24 slices shown (3 of 3)]
[im 1/24]
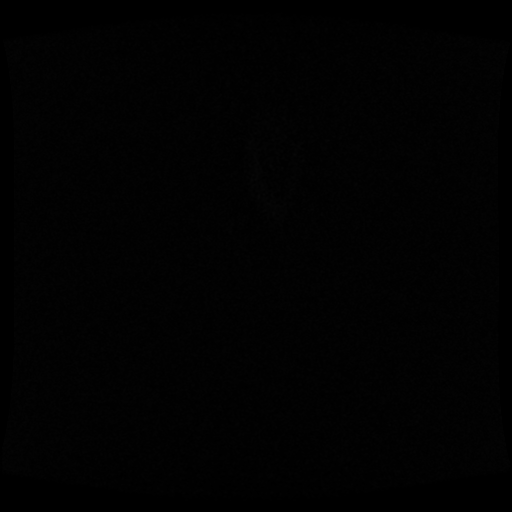
[im 8/24]
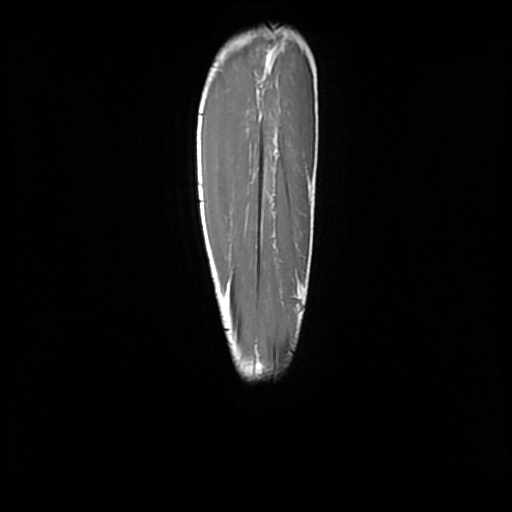
[im 16/24]
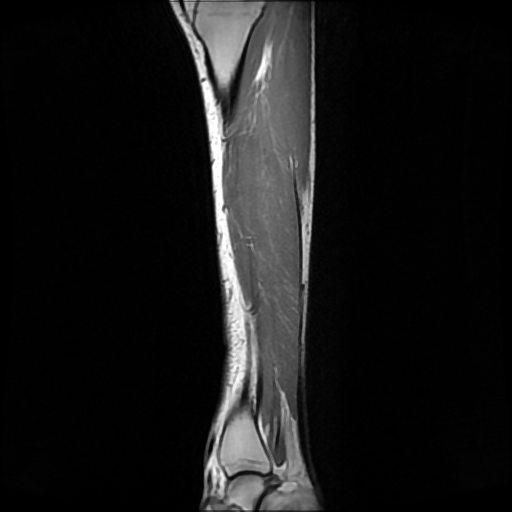
[im 24/24]
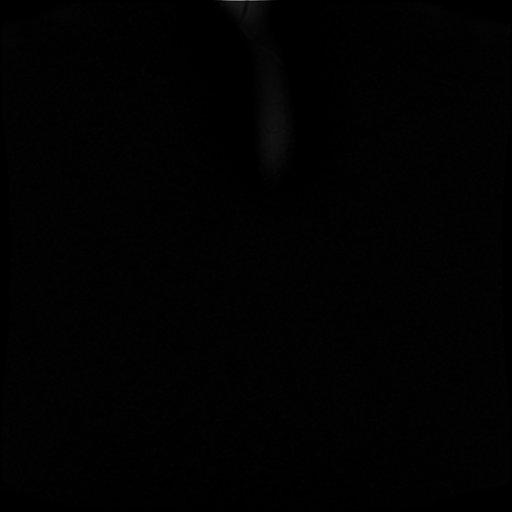

[Series 11: T1 fat-sat · axial · non-contrast · 7.0mm · 0.68mm/px · z∈[-211,-139]mm · 2 of 35 slices shown]
[im 1/35]
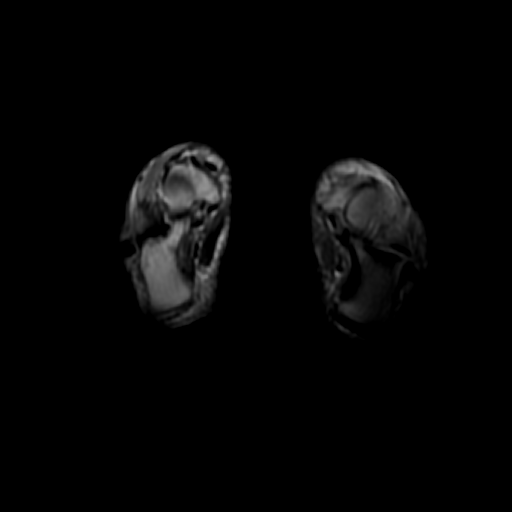
[im 9/35]
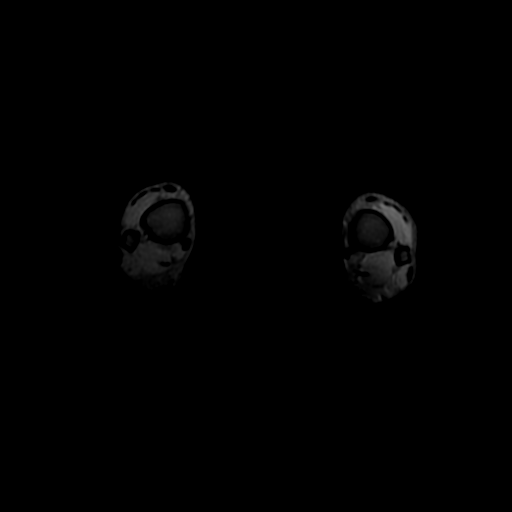

[19 of 40 positions shown; findings below may reference images not displayed]

FINDINGS: Bones/Joint/Cartilage

Extremity coil was utilized. Both lower legs are included on the
axial and coronal images.

No evidence of osteomyelitis. The left tibia and fibula appear
normal. There is no bone marrow edema, cortical destruction or
abnormal enhancement. No acute osseous findings are seen. There are
no large effusions at the knees or ankles.

Ligaments

Not relevant for exam/indication.

Muscles and Tendons

Normal.

Soft tissues

The subcutaneous tissues appear normal. Specifically, no soft tissue
edema, abnormal enhancement or focal fluid collection identified.
The vascular structures appear unremarkable.
IMPRESSION: No evidence of left lower extremity osteomyelitis or other active
infection.
